# Patient Record
Sex: Female | Born: 1975 | Race: Black or African American | Hispanic: No | Marital: Single | Smoking: Never smoker
Health system: Southern US, Community
[De-identification: ages and names within clinical notes are randomized; demographics above are authoritative.]

---

## 2013-08-16 ENCOUNTER — Emergency Department (HOSPITAL_COMMUNITY)
Admission: EM | Admit: 2013-08-16 | Discharge: 2013-08-16 | Disposition: A | Discharge status: Home / Self Care | Payer: Managed Care, Other (non HMO) | Source: Home / Self Care | Attending: Family Medicine | Admitting: Family Medicine

## 2013-08-16 ENCOUNTER — Emergency Department (INDEPENDENT_AMBULATORY_CARE_PROVIDER_SITE_OTHER): Payer: Managed Care, Other (non HMO)

## 2013-08-16 ENCOUNTER — Encounter (HOSPITAL_COMMUNITY): Payer: Self-pay | Admitting: Emergency Medicine

## 2013-08-16 DIAGNOSIS — X58XXXA Exposure to other specified factors, initial encounter: Secondary | ICD-10-CM

## 2013-08-16 DIAGNOSIS — S335XXA Sprain of ligaments of lumbar spine, initial encounter: Secondary | ICD-10-CM

## 2013-08-16 DIAGNOSIS — S39012A Strain of muscle, fascia and tendon of lower back, initial encounter: Secondary | ICD-10-CM

## 2013-08-16 LAB — POCT PREGNANCY, URINE: Preg Test, Ur: NEGATIVE

## 2013-08-16 LAB — POCT URINALYSIS DIP (DEVICE)
Bilirubin Urine: NEGATIVE
Glucose, UA: NEGATIVE mg/dL
Hgb urine dipstick: NEGATIVE
KETONES UR: NEGATIVE mg/dL
LEUKOCYTES UA: NEGATIVE
Nitrite: NEGATIVE
Protein, ur: NEGATIVE mg/dL
Urobilinogen, UA: 0.2 mg/dL (ref 0.0–1.0)
pH: 6 (ref 5.0–8.0)

## 2013-08-16 MED ORDER — IBUPROFEN 800 MG PO TABS: 800.0000 mg | ORAL_TABLET | Freq: Three times a day (TID) | ORAL | Status: AC

## 2013-08-16 MED ORDER — CYCLOBENZAPRINE HCL 5 MG PO TABS: 5.0000 mg | ORAL_TABLET | Freq: Three times a day (TID) | ORAL | Status: AC | PRN

## 2013-08-16 NOTE — ED Notes (Signed)
Pt c/o constant back pain onset 2 weeks Sx also include: fevers, nauseas Pain increases when sitting or lying down Denies v/d, urinary sx, abd pain Alert w/no signs of acute distress.
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
## 2014-07-15 ENCOUNTER — Other Ambulatory Visit: Payer: Self-pay

## 2014-07-15 DIAGNOSIS — Z1231 Encounter for screening mammogram for malignant neoplasm of breast: Secondary | ICD-10-CM

## 2014-07-23 ENCOUNTER — Ambulatory Visit
Admission: RE | Admit: 2014-07-23 | Discharge: 2014-07-23 | Disposition: A | Discharge status: Home / Self Care | Payer: PRIVATE HEALTH INSURANCE | Source: Ambulatory Visit

## 2014-07-23 DIAGNOSIS — Z1231 Encounter for screening mammogram for malignant neoplasm of breast: Secondary | ICD-10-CM

## 2014-07-24 ENCOUNTER — Other Ambulatory Visit: Payer: Self-pay | Admitting: Internal Medicine

## 2014-07-24 DIAGNOSIS — R928 Other abnormal and inconclusive findings on diagnostic imaging of breast: Secondary | ICD-10-CM

## 2014-07-29 ENCOUNTER — Ambulatory Visit
Admission: RE | Admit: 2014-07-29 | Discharge: 2014-07-29 | Disposition: A | Discharge status: Home / Self Care | Payer: PRIVATE HEALTH INSURANCE | Source: Ambulatory Visit | Attending: Internal Medicine | Admitting: Internal Medicine

## 2014-07-29 DIAGNOSIS — R928 Other abnormal and inconclusive findings on diagnostic imaging of breast: Secondary | ICD-10-CM

## 2015-01-25 ENCOUNTER — Other Ambulatory Visit: Payer: Self-pay | Admitting: Internal Medicine

## 2015-01-25 DIAGNOSIS — D242 Benign neoplasm of left breast: Secondary | ICD-10-CM

## 2015-01-29 ENCOUNTER — Other Ambulatory Visit: Payer: PRIVATE HEALTH INSURANCE

## 2015-01-29 ENCOUNTER — Ambulatory Visit
Admission: RE | Admit: 2015-01-29 | Discharge: 2015-01-29 | Disposition: A | Discharge status: Home / Self Care | Payer: 59 | Source: Ambulatory Visit | Attending: Internal Medicine | Admitting: Internal Medicine

## 2015-01-29 DIAGNOSIS — D242 Benign neoplasm of left breast: Secondary | ICD-10-CM

## 2017-04-16 ENCOUNTER — Other Ambulatory Visit: Payer: Self-pay | Admitting: Internal Medicine

## 2017-04-16 DIAGNOSIS — N6320 Unspecified lump in the left breast, unspecified quadrant: Secondary | ICD-10-CM

## 2017-04-25 ENCOUNTER — Ambulatory Visit
Admission: RE | Admit: 2017-04-25 | Discharge: 2017-04-25 | Disposition: A | Discharge status: Home / Self Care | Payer: 59 | Source: Ambulatory Visit | Attending: Internal Medicine | Admitting: Internal Medicine

## 2017-04-25 DIAGNOSIS — N6320 Unspecified lump in the left breast, unspecified quadrant: Secondary | ICD-10-CM

## 2018-03-20 ENCOUNTER — Other Ambulatory Visit: Payer: Self-pay | Admitting: Internal Medicine

## 2018-03-20 DIAGNOSIS — Z1231 Encounter for screening mammogram for malignant neoplasm of breast: Secondary | ICD-10-CM

## 2018-04-29 ENCOUNTER — Ambulatory Visit: Payer: 59

## 2018-05-01 ENCOUNTER — Other Ambulatory Visit: Payer: Self-pay

## 2018-05-01 ENCOUNTER — Ambulatory Visit
Admission: RE | Admit: 2018-05-01 | Discharge: 2018-05-01 | Disposition: A | Discharge status: Home / Self Care | Payer: PRIVATE HEALTH INSURANCE | Source: Ambulatory Visit | Attending: Internal Medicine | Admitting: Internal Medicine

## 2018-05-01 DIAGNOSIS — Z1231 Encounter for screening mammogram for malignant neoplasm of breast: Secondary | ICD-10-CM

## 2018-11-05 ENCOUNTER — Encounter: Payer: Self-pay | Admitting: Obstetrics & Gynecology

## 2018-11-05 ENCOUNTER — Ambulatory Visit (INDEPENDENT_AMBULATORY_CARE_PROVIDER_SITE_OTHER): Payer: PRIVATE HEALTH INSURANCE | Admitting: Obstetrics & Gynecology

## 2018-11-05 ENCOUNTER — Other Ambulatory Visit: Payer: Self-pay

## 2018-11-05 VITALS — BP 139/91 | HR 109 | Temp 99.2°F | Ht 66.0 in | Wt 233.9 lb

## 2018-11-05 DIAGNOSIS — Z01419 Encounter for gynecological examination (general) (routine) without abnormal findings: Secondary | ICD-10-CM

## 2018-11-05 DIAGNOSIS — Z1151 Encounter for screening for human papillomavirus (HPV): Secondary | ICD-10-CM

## 2018-11-05 DIAGNOSIS — Z124 Encounter for screening for malignant neoplasm of cervix: Secondary | ICD-10-CM

## 2018-11-05 DIAGNOSIS — Z3009 Encounter for other general counseling and advice on contraception: Secondary | ICD-10-CM

## 2018-11-05 DIAGNOSIS — I10 Essential (primary) hypertension: Secondary | ICD-10-CM

## 2018-11-05 DIAGNOSIS — E669 Obesity, unspecified: Secondary | ICD-10-CM

## 2018-11-05 MED ORDER — ETONOGESTREL-ETHINYL ESTRADIOL 0.12-0.015 MG/24HR VA RING
1.0000 | VAGINAL_RING | VAGINAL | 4 refills | Status: AC
Start: 2018-11-05 — End: ?

## 2018-11-05 MED ORDER — AMLODIPINE BESYLATE 5 MG PO TABS: 5.0000 mg | ORAL_TABLET | Freq: Every day | ORAL | 3 refills | Status: AC

## 2018-11-05 NOTE — Progress Notes (Signed)
Subjective:     Brenda Moon is a 43 y.o. female here for a routine exam.  G2P1011 Current complaints: none. Pt for annual with contraception counseling. BP last check 2 weeks prev and was 150's/90s.  Pt has son who is a Equities trader in college. 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 Sexually active once in 1 year. Used condom.           Gynecologic History Patient's last menstrual period was 10/29/2018. Contraception: NuvaRing vaginal inserts Last Pap: 07/13/2016. Results were: normal Last mammogram: 05/01/2018. Results were: normal  Obstetric History G2P1011  Review of Systems Pertinent items are noted in HPI.    Objective:  BP (!) 139/91 (BP Location: Right Arm, Cuff Size: Large)   Pulse (!) 109   Temp 99.2 F (37.3 C)   Ht 5' 6"  (1.676 m)   Wt 233 lb 14.4 oz (106.1 kg)   LMP 10/29/2018   BMI 37.75 kg/m  Initial BP 152/88 /General Appearance:     Alert, cooperative, no distress, appears stated age  Head:    Normocephalic, without obvious abnormality, atraumatic  Eyes:    conjunctiva/corneas clear, EOM's intact, both eyes  Ears:    Normal external ear canals, both ears  Nose:   Nares normal, septum midline, mucosa normal, no drainage    or sinus tenderness  Throat:   Lips, mucosa, and tongue normal; teeth and gums normal  Neck:   Supple, symmetrical, trachea midline, no adenopathy;    thyroid:  no enlargement/tenderness/nodules  Back:     Symmetric, no curvature, ROM normal, no CVA tenderness  Lungs:     respirations unlabored  Chest Wall:    No tenderness or deformity   Heart:    Regular rate and rhythm  Breast Exam:    No tenderness, masses, or nipple abnormality  Abdomen:     Soft, non-tender, bowel sounds active all four quadrants,    no masses, no organomegaly  Genitalia:    Normal female without lesion, discharge or tenderness     Extremities:   Extremities normal, atraumatic, no cyanosis or edema  Pulses:   2+ and symmetric all extremities  Skin:    Skin color, texture, turgor normal, no rashes or lesions    Assessment:    Healthy female exam.   Chronic HTN- not well controlled.  Contraception counseling - Pt on Nuvaring. Wants to cont.      Plan:    f/u PAP and hrHPV Nuvaring refilled Add Amlodipine F/u 2 weeks for BP check F/u 1 year annual  Walking 6 days per week F/u with primary care.   Sylina Henion L. Harraway-Smith, M.D., Cherlynn June

## 2018-11-05 NOTE — Progress Notes (Addendum)
New Patient is in the office to establish care, last pap 07-13-16, last mammogram 05-01-18.  C/o night sweats x1 year, hair thinning x 2 months. Need refills for NuvaRing. GAD-7=5

## 2018-11-05 NOTE — Patient Instructions (Signed)
Hypertension, Adult Hypertension is another name for high blood pressure. High blood pressure forces your heart to work harder to pump blood. This can cause problems over time. There are two numbers in a blood pressure reading. There is a top number (systolic) over a bottom number (diastolic). It is best to have a blood pressure that is below 120/80. Healthy choices can help lower your blood pressure, or you may need medicine to help lower it. What are the causes? The cause of this condition is not known. Some conditions may be related to high blood pressure. What increases the risk?  Smoking.  Having type 2 diabetes mellitus, high cholesterol, or both.  Not getting enough exercise or physical activity.  Being overweight.  Having too much fat, sugar, calories, or salt (sodium) in your diet.  Drinking too much alcohol.  Having long-term (chronic) kidney disease.  Having a family history of high blood pressure.  Age. Risk increases with age.  Race. You may be at higher risk if you are African American.  Gender. Men are at higher risk than women before age 45. After age 65, women are at higher risk than men.  Having obstructive sleep apnea.  Stress. What are the signs or symptoms?  High blood pressure may not cause symptoms. Very high blood pressure (hypertensive crisis) may cause: ? Headache. ? Feelings of worry or nervousness (anxiety). ? Shortness of breath. ? Nosebleed. ? A feeling of being sick to your stomach (nausea). ? Throwing up (vomiting). ? Changes in how you see. ? Very bad chest pain. ? Seizures. How is this treated?  This condition is treated by making healthy lifestyle changes, such as: ? Eating healthy foods. ? Exercising more. ? Drinking less alcohol.  Your health care provider may prescribe medicine if lifestyle changes are not enough to get your blood pressure under control, and if: ? Your top number is above 130. ? Your bottom number is above 80.   Your personal target blood pressure may vary. Follow these instructions at home: Eating and drinking   If told, follow the DASH eating plan. To follow this plan: ? Fill one half of your plate at each meal with fruits and vegetables. ? Fill one fourth of your plate at each meal with whole grains. Whole grains include whole-wheat pasta, brown rice, and whole-grain bread. ? Eat or drink low-fat dairy products, such as skim milk or low-fat yogurt. ? Fill one fourth of your plate at each meal with low-fat (lean) proteins. Low-fat proteins include fish, chicken without skin, eggs, beans, and tofu. ? Avoid fatty meat, cured and processed meat, or chicken with skin. ? Avoid pre-made or processed food.  Eat less than 1,500 mg of salt each day.  Do not drink alcohol if: ? Your doctor tells you not to drink. ? You are pregnant, may be pregnant, or are planning to become pregnant.  If you drink alcohol: ? Limit how much you use to:  0-1 drink a day for women.  0-2 drinks a day for men. ? Be aware of how much alcohol is in your drink. In the [place] one drink equals one 12 oz bottle of beer (355 mL), one 5 oz glass of wine (148 mL), or one 1 oz glass of hard liquor (44 mL). Lifestyle   Work with your doctor to stay at a healthy weight or to lose weight. Ask your doctor what the best weight is for you.  Get at least 30 minutes of exercise most   days of the week. This may include walking, swimming, or biking.  Get at least 30 minutes of exercise that strengthens your muscles (resistance exercise) at least 3 days a week. This may include lifting weights or doing Pilates.  Do not use any products that contain nicotine or tobacco, such as cigarettes, e-cigarettes, and chewing tobacco. If you need help quitting, ask your doctor.  Check your blood pressure at home as told by your doctor.  Keep all follow-up visits as told by your doctor. This is important. Medicines  Take over-the-counter and  prescription medicines only as told by your doctor. Follow directions carefully.  Do not skip doses of blood pressure medicine. The medicine does not work as well if you skip doses. Skipping doses also puts you at risk for problems.  Ask your doctor about side effects or reactions to medicines that you should watch for. Contact a doctor if you:  Think you are having a reaction to the medicine you are taking.  Have headaches that keep coming back (recurring).  Feel dizzy.  Have swelling in your ankles.  Have trouble with your vision. Get help right away if you:  Get a very bad headache.  Start to feel mixed up (confused).  Feel weak or numb.  Feel faint.  Have very bad pain in your: ? Chest. ? Belly (abdomen).  Throw up more than once.  Have trouble breathing. Summary  Hypertension is another name for high blood pressure.  High blood pressure forces your heart to work harder to pump blood.  For most people, a normal blood pressure is less than 120/80.  Making healthy choices can help lower blood pressure. If your blood pressure does not get lower with healthy choices, you may need to take medicine. This information is not intended to replace advice given to you by your health care provider. Make sure you discuss any questions you have with your health care provider. Document Released: 07/19/2007 Document Revised: 10/10/2017 Document Reviewed: 10/10/2017 Elsevier Patient Education  2020 [place]. Preventing Cerebrovascular Disease  Arteries are blood vessels that carry blood that contains oxygen from the heart to all parts of the body. Cerebrovascular disease affects arteries that supply the brain. Any condition that blocks or disrupts blood flow to the brain can cause cerebrovascular disease. Brain cells that lose blood supply start to die within minutes (stroke). Stroke is the main danger of cerebrovascular disease. Atherosclerosis and high blood pressure are  common causes of cerebrovascular disease. Atherosclerosis is narrowing and hardening of an artery that results when fat, cholesterol, calcium, or other substances (plaque) build up inside an artery. Plaque reduces blood flow through the artery. High blood pressure increases the risk of bleeding inside the brain. Making diet and lifestyle changes to prevent atherosclerosis and high blood pressure lowers your risk of cerebrovascular disease. What nutrition changes can be made?  Eat more fruits, vegetables, and whole grains.  Reduce how much saturated fat you eat. To do this, eat less red meat and fewer full-fat dairy products.  Eat healthy proteins instead of red meat. Healthy proteins include: ? Fish. Eat fish that contains heart-healthy omega-3 fatty acids, twice a week. Examples include salmon, albacore tuna, mackerel, and herring. ? Chicken. ? Nuts. ? Low-fat or nonfat yogurt.  Avoid processed meats, like bacon and lunchmeat.  Avoid foods that contain: ? A lot of sugar, such as sweets and drinks with added sugar. ? A lot of salt (sodium). Avoid adding extra salt to your food, as  told by your health care provider. ? Trans fats, such as margarine and baked goods. Trans fats may be listed as "partially hydrogenated oils" on food labels.  Check food labels to see how much sodium, sugar, and trans fats are in foods.  Use vegetable oils that contain low amounts of saturated fat, such as olive oil or canola oil. What lifestyle changes can be made?  Drink alcohol in moderation. This means no more than 1 drink a day for nonpregnant women and 2 drinks a day for men. One drink equals 12 oz of beer, 5 oz of wine, or 1 oz of hard liquor.  If you are overweight, ask your health care provider to recommend a weight-loss plan for you. Losing 5-10 lb (2.2-4.5 kg) can reduce your risk of diabetes, atherosclerosis, and high blood pressure.  Exercise for 30?60 minutes on most days, or as much as told by  your health care provider. ? Do moderate-intensity exercise, such as brisk walking, bicycling, and water aerobics. Ask your health care provider which activities are safe for you.   Do not use any products that contain nicotine or tobacco, such as cigarettes and e-cigarettes. If you need help quitting, ask your health care provider. Why are these changes important? Making these changes lowers your risk of many diseases that can cause cerebrovascular disease and stroke. Stroke is a leading cause of death and disability. Making these changes also improves your overall health and quality of life. What can I do to lower my risk? The following factors make you more likely to develop cerebrovascular disease:  Being overweight.  Smoking.  Being physically inactive.  Eating a high-fat diet.  Having certain health conditions, such as: ? Diabetes. ? High blood pressure. ? Heart disease. ? Atherosclerosis. ? High cholesterol. ? Sickle cell disease.  Talk with your health care provider about your risk for cerebrovascular disease. Work with your health care provider to control diseases that you have that may contribute to cerebrovascular disease. Your health care provider may prescribe medicines to help prevent major causes of cerebrovascular disease. Where to find more information Learn more about preventing cerebrovascular disease from:  [place], Lung, and [place]: MoAnalyst.de  Centers for Disease Control and Prevention: http://www.curry-wood.biz/ Summary  Cerebrovascular disease can lead to a stroke.  Atherosclerosis and high blood pressure are major causes of cerebrovascular disease.  Making diet and lifestyle changes can reduce your risk of cerebrovascular disease.  Work with your health care provider to get your risk factors under control to reduce your risk of cerebrovascular disease. This information is not intended to replace  advice given to you by your health care provider. Make sure you discuss any questions you have with your health care provider. Document Released: 02/14/2015 Document Revised: 01/12/2017 Document Reviewed: 02/14/2015 Elsevier Patient Education  2020 [employer].

## 2018-11-11 LAB — CYTOLOGY - PAP
Diagnosis: NEGATIVE
High risk HPV: NEGATIVE
Molecular Disclaimer: 56
Molecular Disclaimer: NORMAL

## 2018-11-19 ENCOUNTER — Telehealth: Payer: PRIVATE HEALTH INSURANCE

## 2018-11-19 ENCOUNTER — Telehealth: Payer: Self-pay

## 2018-11-19 VITALS — BP 140/90

## 2018-11-19 DIAGNOSIS — I10 Essential (primary) hypertension: Secondary | ICD-10-CM

## 2018-11-19 NOTE — Telephone Encounter (Signed)
Spoke with patient over the phone for blood pressure check. Pt was started on Norvasc 55m daily two weeks ago. Pt reports she has been taking her medication daily in the morning. BP today is 140/90. Pt reports no symptoms. Pt reports she has an appt with PCP next month.

## 2018-11-19 NOTE — Progress Notes (Addendum)
Pt is on the phone for blood pressure check with nurse. Pt was seen by Dr. Ihor Dow on 11/05/18 and prescribed amlodipine 5 mg daily. Pt reports  BP today is 140/90. Pt reports that she has an appt with PCP next month. Pt denies any HA's or blurry vision, states she is feeling well. I advised pt to keep appointment with PCP for next month to follow up on this issue. Pt verbalizes understanding.   Attestation of Attending Supervision of RN: Evaluation and management procedures were performed by the nurse under my supervision and collaboration.  I have reviewed the nursing note and chart, and I agree with the management and plan.  Carolyn L. Harraway-Smith, M.D., Cherlynn June

## 2019-04-14 ENCOUNTER — Other Ambulatory Visit: Payer: Self-pay | Admitting: Internal Medicine

## 2019-04-14 DIAGNOSIS — Z1231 Encounter for screening mammogram for malignant neoplasm of breast: Secondary | ICD-10-CM

## 2019-05-15 ENCOUNTER — Other Ambulatory Visit: Payer: Self-pay

## 2019-05-15 ENCOUNTER — Ambulatory Visit
Admission: RE | Admit: 2019-05-15 | Discharge: 2019-05-15 | Disposition: A | Discharge status: Home / Self Care | Payer: PRIVATE HEALTH INSURANCE | Source: Ambulatory Visit | Attending: Internal Medicine | Admitting: Internal Medicine

## 2019-05-15 DIAGNOSIS — Z1231 Encounter for screening mammogram for malignant neoplasm of breast: Secondary | ICD-10-CM

## 2020-04-20 ENCOUNTER — Other Ambulatory Visit: Payer: Self-pay | Admitting: Internal Medicine

## 2020-04-20 DIAGNOSIS — Z1231 Encounter for screening mammogram for malignant neoplasm of breast: Secondary | ICD-10-CM

## 2020-05-18 ENCOUNTER — Other Ambulatory Visit: Payer: Self-pay

## 2020-05-18 ENCOUNTER — Ambulatory Visit
Admission: RE | Admit: 2020-05-18 | Discharge: 2020-05-18 | Disposition: A | Discharge status: Home / Self Care | Payer: PRIVATE HEALTH INSURANCE | Source: Ambulatory Visit | Attending: Internal Medicine | Admitting: Internal Medicine

## 2020-05-18 DIAGNOSIS — Z1231 Encounter for screening mammogram for malignant neoplasm of breast: Secondary | ICD-10-CM

## 2021-05-06 ENCOUNTER — Other Ambulatory Visit: Payer: Self-pay | Admitting: Nurse Practitioner

## 2021-05-06 DIAGNOSIS — Z1231 Encounter for screening mammogram for malignant neoplasm of breast: Secondary | ICD-10-CM

## 2021-05-19 ENCOUNTER — Ambulatory Visit
Admission: RE | Admit: 2021-05-19 | Discharge: 2021-05-19 | Disposition: A | Discharge status: Home / Self Care | Payer: BC Managed Care – PPO | Source: Ambulatory Visit | Attending: Nurse Practitioner | Admitting: Nurse Practitioner

## 2021-05-19 DIAGNOSIS — Z1231 Encounter for screening mammogram for malignant neoplasm of breast: Secondary | ICD-10-CM

## 2021-05-20 ENCOUNTER — Other Ambulatory Visit: Payer: Self-pay | Admitting: Nurse Practitioner

## 2021-05-20 DIAGNOSIS — R928 Other abnormal and inconclusive findings on diagnostic imaging of breast: Secondary | ICD-10-CM

## 2021-06-06 ENCOUNTER — Ambulatory Visit
Admission: RE | Admit: 2021-06-06 | Discharge: 2021-06-06 | Disposition: A | Discharge status: Home / Self Care | Payer: BC Managed Care – PPO | Source: Ambulatory Visit | Attending: Nurse Practitioner | Admitting: Nurse Practitioner

## 2021-06-06 DIAGNOSIS — R928 Other abnormal and inconclusive findings on diagnostic imaging of breast: Secondary | ICD-10-CM

## 2022-02-18 ENCOUNTER — Encounter (HOSPITAL_COMMUNITY): Payer: Self-pay | Admitting: Pharmacy Technician

## 2022-02-18 ENCOUNTER — Emergency Department (HOSPITAL_COMMUNITY): Payer: BC Managed Care – PPO

## 2022-02-18 ENCOUNTER — Emergency Department (HOSPITAL_COMMUNITY)
Admission: EM | Admit: 2022-02-18 | Discharge: 2022-02-18 | Disposition: A | Discharge status: Home / Self Care | Payer: BC Managed Care – PPO | Attending: Emergency Medicine | Admitting: Emergency Medicine

## 2022-02-18 ENCOUNTER — Other Ambulatory Visit: Payer: Self-pay

## 2022-02-18 DIAGNOSIS — I10 Essential (primary) hypertension: Secondary | ICD-10-CM | POA: Insufficient documentation

## 2022-02-18 DIAGNOSIS — Z79899 Other long term (current) drug therapy: Secondary | ICD-10-CM | POA: Diagnosis not present

## 2022-02-18 DIAGNOSIS — R0981 Nasal congestion: Secondary | ICD-10-CM | POA: Diagnosis present

## 2022-02-18 DIAGNOSIS — E876 Hypokalemia: Secondary | ICD-10-CM | POA: Insufficient documentation

## 2022-02-18 DIAGNOSIS — J0100 Acute maxillary sinusitis, unspecified: Secondary | ICD-10-CM | POA: Insufficient documentation

## 2022-02-18 DIAGNOSIS — H6593 Unspecified nonsuppurative otitis media, bilateral: Secondary | ICD-10-CM

## 2022-02-18 DIAGNOSIS — H7393 Unspecified disorder of tympanic membrane, bilateral: Secondary | ICD-10-CM | POA: Diagnosis not present

## 2022-02-18 DIAGNOSIS — R0789 Other chest pain: Secondary | ICD-10-CM

## 2022-02-18 DIAGNOSIS — Z20822 Contact with and (suspected) exposure to covid-19: Secondary | ICD-10-CM | POA: Diagnosis not present

## 2022-02-18 LAB — BASIC METABOLIC PANEL
Anion gap: 12 (ref 5–15)
BUN: 11 mg/dL (ref 6–20)
CO2: 22 mmol/L (ref 22–32)
Calcium: 8.7 mg/dL — ABNORMAL LOW (ref 8.9–10.3)
Chloride: 104 mmol/L (ref 98–111)
Creatinine, Ser: 0.93 mg/dL (ref 0.44–1.00)
GFR, Estimated: >60 mL/min (ref >60)
Glucose, Bld: 100 mg/dL — ABNORMAL HIGH (ref 70–99)
Potassium: 2.8 mmol/L — ABNORMAL LOW (ref 3.5–5.1)
Sodium: 138 mmol/L (ref 135–145)

## 2022-02-18 LAB — CBC WITH DIFFERENTIAL/PLATELET
Abs Immature Granulocytes: 0.05 10*3/uL (ref 0.00–0.07)
Basophils Absolute: 0 10*3/uL (ref 0.0–0.1)
Basophils Relative: 0 %
Eosinophils Absolute: 0.1 10*3/uL (ref 0.0–0.5)
Eosinophils Relative: 1 %
HCT: 41 % (ref 36.0–46.0)
Hemoglobin: 13.7 g/dL (ref 12.0–15.0)
Immature Granulocytes: 1 %
Lymphocytes Relative: 20 %
Lymphs Abs: 2 10*3/uL (ref 0.7–4.0)
MCH: 29.7 pg (ref 26.0–34.0)
MCHC: 33.4 g/dL (ref 30.0–36.0)
MCV: 88.7 fL (ref 80.0–100.0)
Monocytes Absolute: 0.8 10*3/uL (ref 0.1–1.0)
Monocytes Relative: 7 %
Neutro Abs: 7.4 10*3/uL (ref 1.7–7.7)
Neutrophils Relative %: 71 %
Platelets: 304 10*3/uL (ref 150–400)
RBC: 4.62 MIL/uL (ref 3.87–5.11)
RDW: 12.9 % (ref 11.5–15.5)
WBC: 10.4 10*3/uL (ref 4.0–10.5)
nRBC: 0 % (ref 0.0–0.2)

## 2022-02-18 LAB — TROPONIN I (HIGH SENSITIVITY): Troponin I (High Sensitivity): <2 ng/L (ref <18)

## 2022-02-18 LAB — RESP PANEL BY RT-PCR (RSV, FLU A&B, COVID)  RVPGX2
Influenza A by PCR: NEGATIVE
Influenza B by PCR: NEGATIVE
Resp Syncytial Virus by PCR: NEGATIVE
SARS Coronavirus 2 by RT PCR: NEGATIVE

## 2022-02-18 MED ORDER — POTASSIUM CHLORIDE CRYS ER 20 MEQ PO TBCR: 40.0000 meq | EXTENDED_RELEASE_TABLET | Freq: Every day | ORAL | 0 refills | Status: AC

## 2022-02-18 MED ORDER — POTASSIUM CHLORIDE CRYS ER 20 MEQ PO TBCR
60.0000 meq | EXTENDED_RELEASE_TABLET | Freq: Once | ORAL | Status: AC
  Administered 2022-02-18: 60 meq via ORAL
  Filled 2022-02-18: qty 3

## 2022-02-18 NOTE — ED Triage Notes (Signed)
Pt here with sinus pressure, otalgia onset 3 days ago. Denies fevers, chill, cough, chest pain, shob.

## 2022-02-18 NOTE — ED Provider Triage Note (Signed)
Emergency Medicine Provider Triage Evaluation Note  Brenda Moon , a 47 y.o. female  was evaluated in triage.  Pt complains of URI symptoms including mild sore throat, congestion, sinus pressure, and ear pressure over the last 3 to 4 days.  Also reports body aches.  Denies cough, fever, chills, N/V/D, abdominal pain, chest pain.  Able to swallow without difficulty.  Review of Systems  Positive:  Negative: See above  Physical Exam  BP (!) 130/96 (BP Location: Right Arm)   Pulse 99   Temp 98.5 F (36.9 C) (Oral)   Resp 18   SpO2 100%  Gen:   Awake, no distress   Resp:  Normal effort  MSK:   Moves extremities without difficulty  Other:  Airway patent.  Mild erythema and cobblestoning of posterior oropharynx.  Very mild middle ear effusions bilaterally, bony structures visible, mild bulge.  Medical Decision Making  Medically screening exam initiated at 6:44 PM.  Appropriate orders placed.  KELLA SPLINTER was informed that the remainder of the evaluation will be completed by another provider, this initial triage assessment does not replace that evaluation, and the importance of remaining in the ED until their evaluation is complete.     Prince Rome, [place] 29/19/16 1848

## 2022-02-18 NOTE — Discharge Instructions (Addendum)
You have been seen today for your complaint of sinus pressure and chest pain. Your lab work showed low potassium, was otherwise unremarkable. Your imaging was unremarkable and showed no abnormalities. Your discharge medications include potassium. Take it as prescribed.  Alternate tylenol and ibuprofen for pain. You may alternate these every 4 hours. You may take up to 800 mg of ibuprofen at a time and up to 1000 mg of tylenol. Home care instructions are as follows:  You may use Claritin, Zyrtec, or Allegra daily for your symptoms.  You may also use Flonase, saline nasal spray, and Nettie Potts. Follow up with: Your primary care provider regarding your low potassium in 1 week Please seek immediate medical care if you develop any of the following symptoms: You have a severe headache. You have persistent vomiting. You have severe pain or swelling around your face or eyes. You have vision problems. You develop confusion. Your neck is stiff. You have trouble breathing. At this time there does not appear to be the presence of an emergent medical condition, however there is always the potential for conditions to change. Please read and follow the below instructions.  Do not take your medicine if  develop an itchy rash, swelling in your mouth or lips, or difficulty breathing; call 911 and seek immediate emergency medical attention if this occurs.  You may review your lab tests and imaging results in their entirety on your MyChart account.  Please discuss all results of fully with your primary care provider and other specialist at your follow-up visit.  Note: Portions of this text may have been transcribed using voice recognition software. Every effort was made to ensure accuracy; however, inadvertent computerized transcription errors may still be present.

## 2022-02-18 NOTE — ED Provider Notes (Signed)
[facility] EMER[facility]ENC[facility] DEPARTMENT Provider Note   CSN: [id number] Arrival date & time: 02/18/22  1718     History  Chief Complaint  Patient presents with   Facial Pain   Otalgia    Brenda Moon is a 47 y.o. female.  Who presents to the ED for evaluation of 3 days of congestion, sinus pressure, bilateral ear pressure, body aches, chest pain, mild sore throat.  She works in the [employer] and has been exposed to [place] recently.  Has tried Tylenol and ibuprofen at home with moderate relief of her symptoms.  Activity makes the chest achiness worse.  Denies fevers, chills, shortness of breath, cough, numbness, tingling, muffled hearing.  Reports history of hypertension, denies other cardiac history.  Denies family history of cardiac disease.  Chest pain is absent at rest.  She does have a history of hypokalemia, was given a one-time dose of potassium approximately 1 year ago when switching antihypertensive classes by her primary care provider.  Denies nausea, vomiting, diarrhea.  Otalgia      Home Medications Prior to Admission medications   Medication Sig Start Date End Date Taking? Authorizing Provider  potassium chloride SA (KLOR-CON M) 20 MEQ tablet Take 2 tablets (40 mEq total) by mouth daily for 4 days. 02/18/22 02/22/22 Yes Mohd. Derflinger, Grafton Folk, PA-C  amLODipine (NORVASC) 5 MG tablet Take 1 tablet (5 mg total) by mouth daily. 11/05/18   Lavonia Drafts, MD  cyclobenzaprine (FLEXERIL) 5 MG tablet Take 1 tablet (5 mg total) by mouth 3 (three) times daily as needed for muscle spasms. Patient not taking: Reported on 11/05/2018 08/16/13   Billy Fischer, MD  etonogestrel-ethinyl estradiol (NUVARING) 0.12-0.015 MG/24HR vaginal ring Place 1 each vaginally every 28 (twenty-eight) days. Insert vaginally and leave in place for 3 consecutive weeks, then remove for 1 week. 11/05/18   Lavonia Drafts, MD  hydrOXYzine (ATARAX/VISTARIL) 25 MG tablet TAKE 1 TABLET BY MOUTH  EVERY DAY AT BEDTIME AS NEEDED FOR SLEEP AS NEEDED 10/08/18   [provider]  ibuprofen (ADVIL,MOTRIN) 800 MG tablet Take 1 tablet (800 mg total) by mouth 3 (three) times daily. Prn back pain Patient not taking: Reported on 11/05/2018 08/16/13   Billy Fischer, MD  Phendimetrazine Tartrate 35 MG TABS TAKE 1 TABLET BY MOUTH ONCE DAILY IN THE MORNING 10/16/18   [provider]      Allergies    Patient has no known allergies.    Review of Systems   Review of Systems  HENT:  Positive for ear pain and sinus pressure.   Cardiovascular:  Positive for chest pain.  All other systems reviewed and are negative.   Physical Exam Updated Vital Signs BP (!) 137/94 (BP Location: Right Arm)   Pulse 96   Temp 98.3 F (36.8 C) (Oral)   Resp 18   SpO2 100%  Physical Exam Vitals and nursing note reviewed.  Constitutional:      General: She is not in acute distress.    Appearance: Normal appearance. She is well-developed. She is obese. She is not ill-appearing, toxic-appearing or diaphoretic.     Comments: Resting comfortably in bed  HENT:     Head: Normocephalic and atraumatic.     Ears:     Comments: Small amount of serous fluid noted behind bilateral tympanic membranes.  No erythema, injection or bulging.  No tragal tenderness.    Mouth/Throat:     Mouth: Mucous membranes are moist.     Pharynx: Oropharynx  is clear. No oropharyngeal exudate or posterior oropharyngeal erythema.     Comments: Mild cobblestoning of the posterior pharynx.  No tonsillar enlargement Eyes:     Conjunctiva/sclera: Conjunctivae normal.  Cardiovascular:     Rate and Rhythm: Normal rate and regular rhythm.     Pulses: Normal pulses.     Heart sounds: No murmur heard. Pulmonary:     Effort: Pulmonary effort is normal. No respiratory distress.     Breath sounds: Normal breath sounds. No stridor. No wheezing, rhonchi or rales.  Abdominal:     Palpations: Abdomen is soft.     Tenderness: There is no  abdominal tenderness.  Musculoskeletal:        General: No swelling.     Cervical back: Neck supple.     Right lower leg: No edema.     Left lower leg: No edema.  Skin:    General: Skin is warm and dry.     Capillary Refill: Capillary refill takes less than 2 seconds.  Neurological:     General: No focal deficit present.     Mental Status: She is alert and oriented to person, place, and time.  Psychiatric:        Mood and Affect: Mood normal.        Behavior: Behavior normal.     ED Results / Procedures / Treatments   Labs (all labs ordered are listed, but only abnormal results are displayed) Labs Reviewed  BASIC METABOLIC PANEL - Abnormal; Notable for the following components:      Result Value   Potassium 2.8 (*)    Glucose, Bld 100 (*)    Calcium 8.7 (*)    All other components within normal limits  RESP PANEL BY RT-PCR (RSV, FLU A&B, COVID)  [id number]  CBC WITH DIFFERENTIAL/PLATELET  TROPONIN I (HIGH SENSITIVITY)    EKG EKG Interpretation  Date/Time:  Saturday February 18 2022 18:14:32 EST Ventricular Rate:  99 PR Interval:  156 QRS Duration: 88 QT Interval:  382 QTC Calculation: 490 R Axis:   15 Text Interpretation: Normal sinus rhythm T wave abnormality, consider anterior ischemia Prolonged QT Abnormal ECG Confirmed by Sherwood Gambler [phone]) on 02/18/2022 8:06:17 PM  Radiology DG Chest 2 View  Result Date: 02/18/2022 CLINICAL DATA:  Shortness of breath EXAM: CHEST - 2 VIEW COMPARISON:  None Available. FINDINGS: Cardiac silhouette is unremarkable. No pneumothorax or pleural effusion. The lungs are clear. The visualized skeletal structures are unremarkable. IMPRESSION: No acute cardiopulmonary process. Electronically Signed   By: Sammie Bench M.D.   On: 02/18/2022 19:22    Procedures Procedures    Medications Ordered in ED Medications  potassium chloride SA (KLOR-CON M) CR tablet 60 mEq (60 mEq Oral Given 02/18/22 2157)    ED Course/ Medical Decision Making/  A&P           HEART Score: 2                Medical Decision Making Risk Prescription drug management.  This patient presents to the ED for concern of URI symptoms, chest pain, this involves an extensive number of treatment options, and is a complaint that carries with it a high risk of complications and morbidity.  The differential diagnosis includes flu, COVID, RSV, other URI The emergent differential diagnosis of chest pain includes: Acute coronary syndrome, pericarditis, aortic dissection, pulmonary embolism, tension pneumothorax, and esophageal rupture.  I do not believe the patient has an emergent cause of chest pain, other urgent/non-acute  considerations include, but are not limited to: chronic angina, aortic stenosis, cardiomyopathy, myocarditis, mitral valve prolapse, pulmonary hypertension, hypertrophic obstructive cardiomyopathy (HOCM), aortic insufficiency, right ventricular hypertrophy, pneumonia, pleuritis, bronchitis, pneumothorax, tumor, gastroesophageal reflux disease (GERD), esophageal spasm, Mallory-Weiss syndrome, peptic ulcer disease, biliary disease, pancreatitis, functional gastrointestinal pain, cervical or thoracic disk disease or arthritis, shoulder arthritis, costochondritis, subacromial bursitis, anxiety or panic attack, herpes zoster, breast disorders, chest wall tumors, thoracic outlet syndrome, mediastinitis.   Co morbidities that complicate the patient evaluation   hypertension  My initial workup includes  Additional history obtained from: Nursing notes from this visit.  I ordered, reviewed and interpreted labs which include: BMP, CBC, respiratory panel, troponin.  BMP significant for hypokalemia of 2.8  I ordered imaging studies including chest x-ray I independently visualized and interpreted imaging which showed normal I agree with the radiologist interpretation  Afebrile, slightly hypertensive but otherwise hemodynamically stable.  47 year old female  presenting to the ED for evaluation of nasal congestion and bilateral ear pain and fullness.  She also adds she has had chest pain for the past 3 days which she describes as a mild ache.  Physical exam is remarkable for fluid levels behind bilateral tympanic membranes without erythema or injection or bulging.  Low suspicion for purulent otitis media, rather more likely due to her upper respiratory tract infection.  The rest of her exam is benign.  Lab workup significant for hypokalemia of 2.8.  This was treated in the ED with p.o. potassium.  She will also be sent a prescription for 4 days of p.o. potassium as well.  Her troponin is negative.  Her chest x-ray is normal.  EKG shows no signs of ischemia.  Minimally prolonged QT interval of 490.  Heart score of 2 and I have low suspicion for ACS as the cause of her pain.  She was encouraged to follow-up with her primary care provider in 1 week for reevaluation of her potassium levels.  She was given strict return precautions.  Stable at discharge.  At this time there does not appear to be any evidence of an acute emergency medical condition and the patient appears stable for discharge with appropriate outpatient follow up. Diagnosis was discussed with patient who verbalizes understanding of care plan and is agreeable to discharge. I have discussed return precautions with patient who verbalizes understanding. Patient encouraged to follow-up with their PCP within 1 week. All questions answered.  Note: Portions of this report may have been transcribed using voice recognition software. Every effort was made to ensure accuracy; however, inadvertent computerized transcription errors may still be present.         Final Clinical Impression(s) / ED Diagnoses Final diagnoses:  Acute non-recurrent maxillary sinusitis  Fluid level behind tympanic membrane of both ears  Chest tightness  Hypokalemia    Rx / DC Orders ED Discharge Orders          Ordered     potassium chloride SA (KLOR-CON M) 20 MEQ tablet  Daily        02/18/22 2249              Roylene Reason, Hershal Coria 02/18/22 2259    Sherwood Gambler, MD 02/19/22 [phone]

## 2022-03-11 IMAGING — MG MM DIGITAL SCREENING BILAT W/ TOMO AND CAD
8 series · 8 of 24 positions shown · non-contrast
Comparison: Previous exam(s).

CLINICAL DATA: Screening.

EXAM:
DIGITAL SCREENING BILATERAL MAMMOGRAM WITH TOMOSYNTHESIS AND CAD
TECHNIQUE: Bilateral screening digital craniocaudal and mediolateral oblique
mammograms were obtained. Bilateral screening digital breast
tomosynthesis was performed. The images were evaluated with
computer-aided detection.

[L MLO synth-2D]
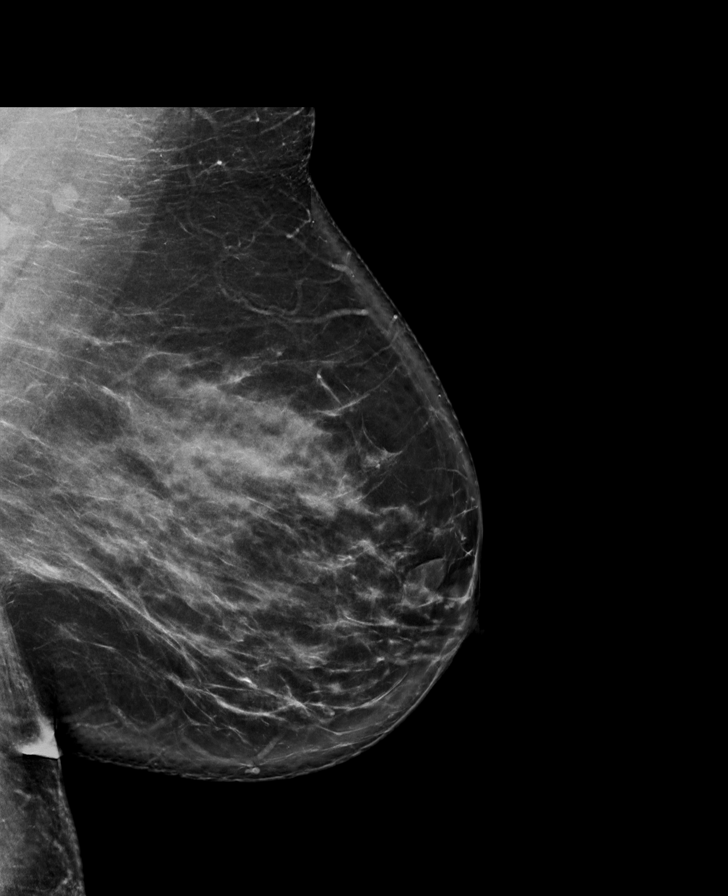

[R CC synth-2D]
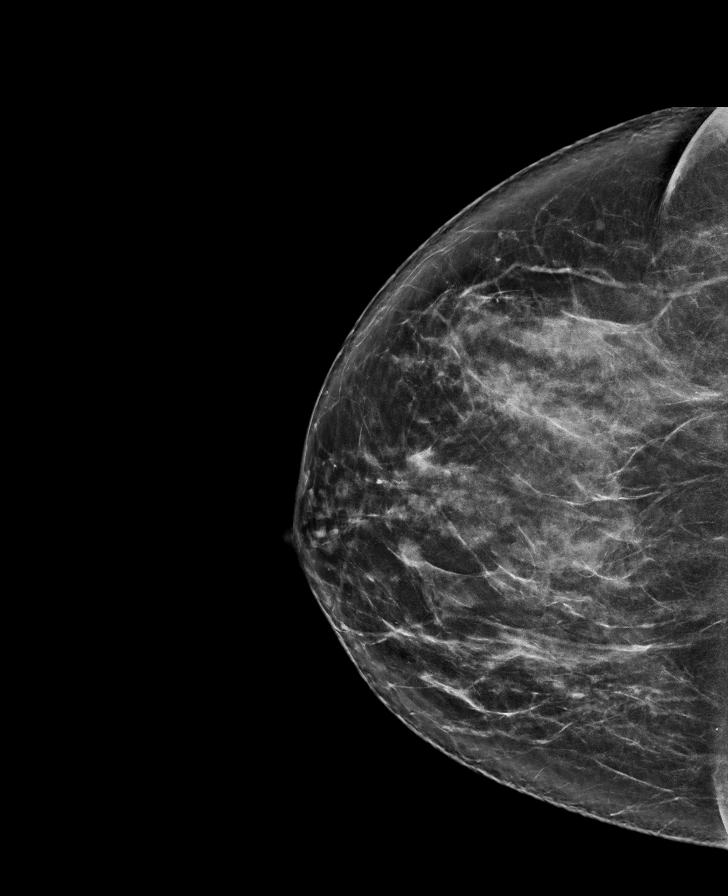

[R MLO synth-2D]
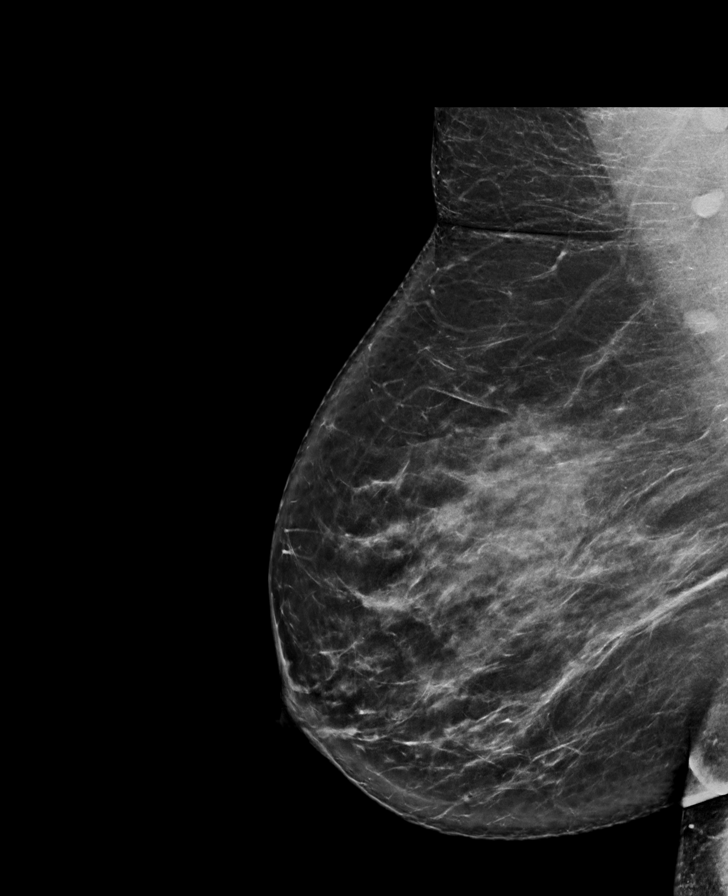

[L CC synth-2D]
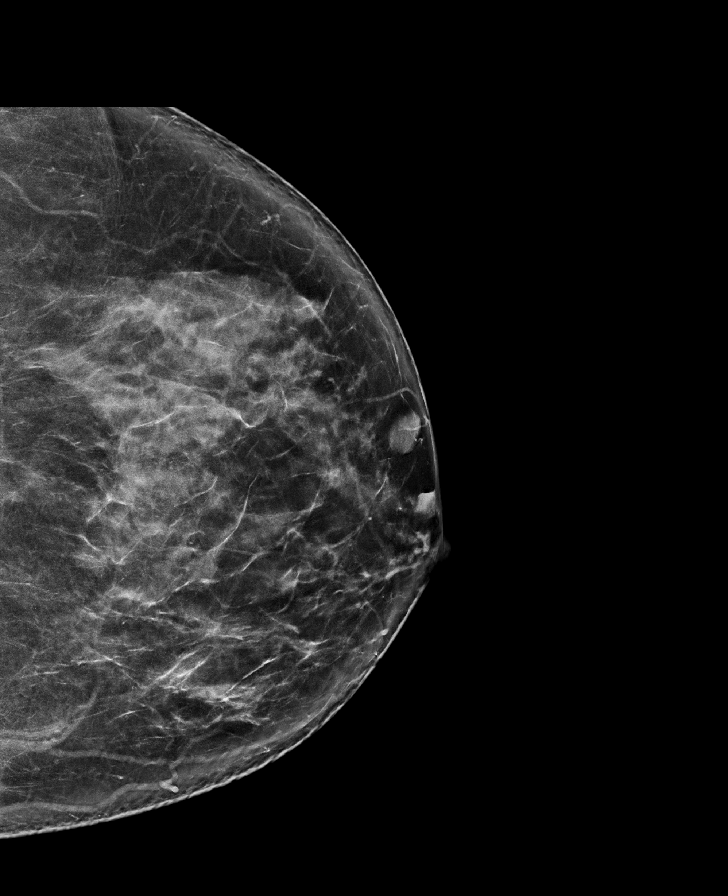

[L CC tomo · tomo slice 43/84.0]
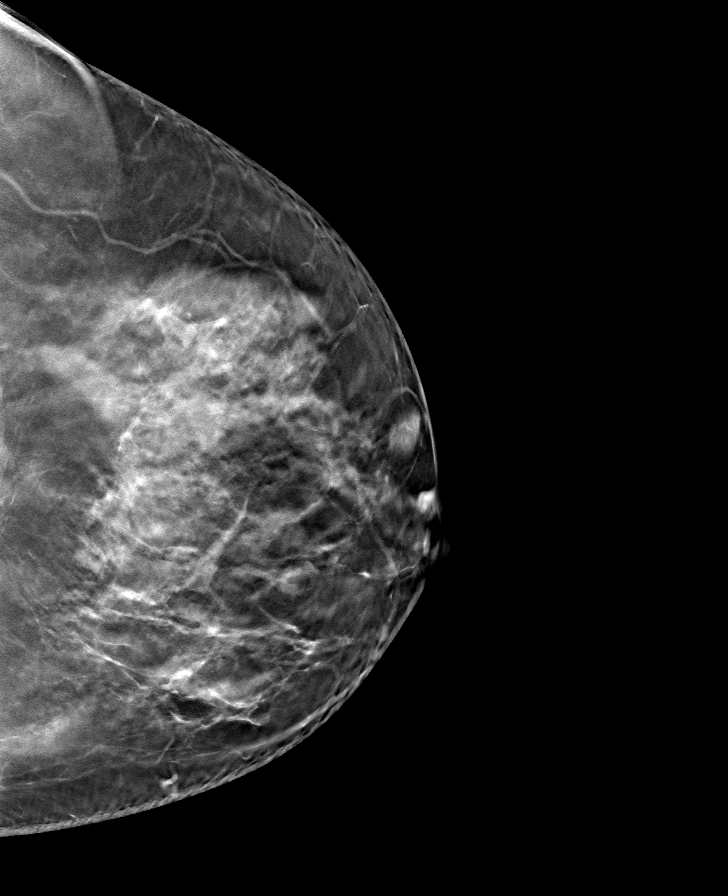

[L MLO tomo · tomo slice 50/99.0]
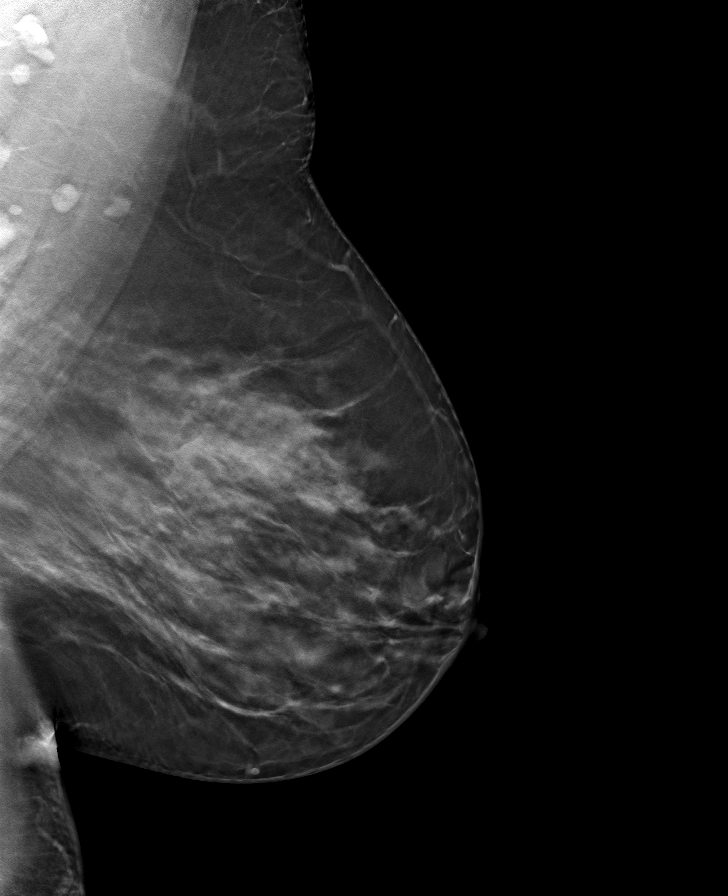

[R CC tomo · tomo slice 42/83.0]
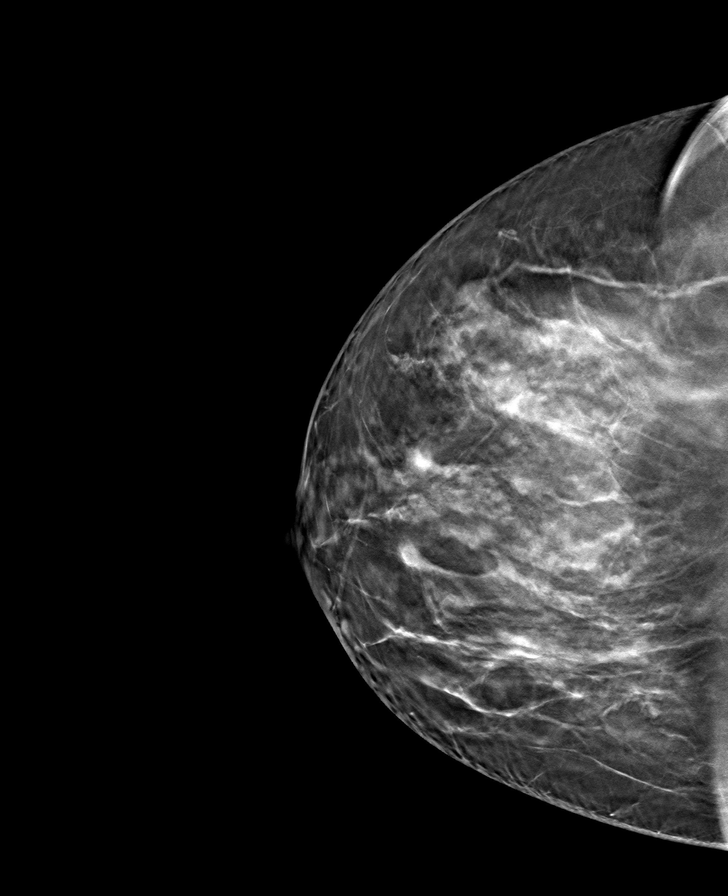

[R MLO tomo · tomo slice 47/93.0]
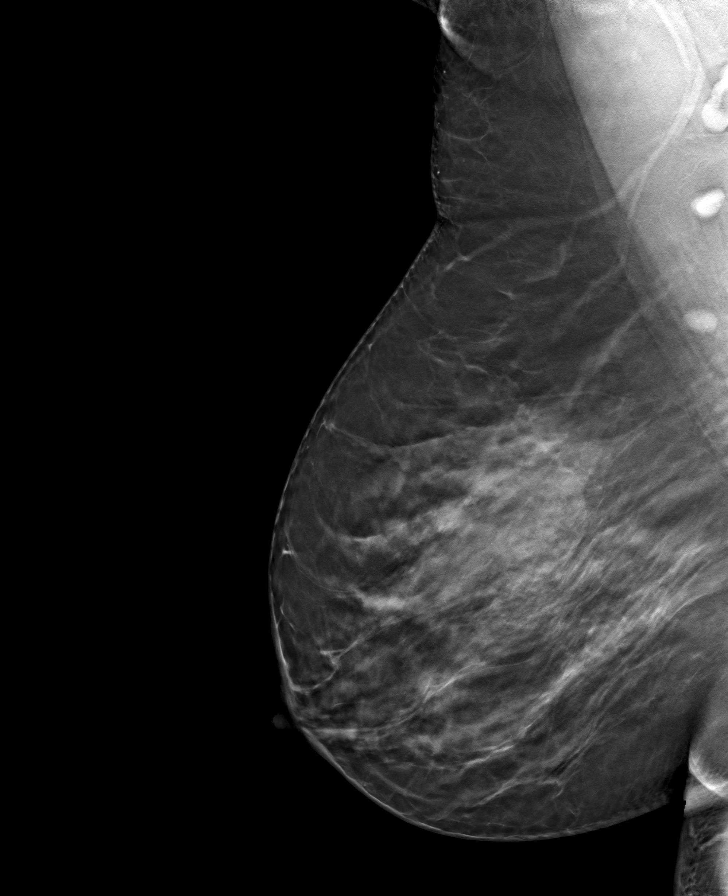

[8 of 24 positions shown; findings below may reference images not displayed]

ACR Breast Density Category c: The breast tissue is heterogeneously
dense, which may obscure small masses.
FINDINGS: There are no findings suspicious for malignancy. The images were
evaluated with computer-aided detection.
IMPRESSION: No mammographic evidence of malignancy. A result letter of this
screening mammogram will be mailed directly to the patient.

RECOMMENDATION:
Screening mammogram in one year. (Code:T4-5-GWO)

BI-RADS CATEGORY  1: Negative.

## 2023-01-02 ENCOUNTER — Other Ambulatory Visit: Payer: Self-pay | Admitting: Nurse Practitioner

## 2023-01-02 DIAGNOSIS — Z1231 Encounter for screening mammogram for malignant neoplasm of breast: Secondary | ICD-10-CM

## 2023-01-31 ENCOUNTER — Ambulatory Visit
Admission: RE | Admit: 2023-01-31 | Discharge: 2023-01-31 | Disposition: A | Discharge status: Home / Self Care | Payer: BC Managed Care – PPO | Source: Ambulatory Visit | Attending: Nurse Practitioner | Admitting: Nurse Practitioner

## 2023-01-31 DIAGNOSIS — Z1231 Encounter for screening mammogram for malignant neoplasm of breast: Secondary | ICD-10-CM

## 2024-01-23 ENCOUNTER — Other Ambulatory Visit: Payer: Self-pay | Admitting: Nurse Practitioner

## 2024-01-23 DIAGNOSIS — Z1231 Encounter for screening mammogram for malignant neoplasm of breast: Secondary | ICD-10-CM

## 2024-02-15 ENCOUNTER — Other Ambulatory Visit: Payer: Self-pay | Admitting: Medical Genetics

## 2024-02-20 ENCOUNTER — Ambulatory Visit
Admission: RE | Admit: 2024-02-20 | Discharge: 2024-02-20 | Disposition: A | Discharge status: Home / Self Care | Source: Ambulatory Visit | Attending: Nurse Practitioner | Admitting: Nurse Practitioner

## 2024-02-20 DIAGNOSIS — Z1231 Encounter for screening mammogram for malignant neoplasm of breast: Secondary | ICD-10-CM
